# Patient Record
Sex: Female | Born: 2000 | Race: White | Hispanic: No | Marital: Single | Smoking: Current every day smoker
Health system: Southern US, Community
[De-identification: ages and names within clinical notes are randomized; demographics above are authoritative.]

## PROBLEM LIST (undated history)

## (undated) HISTORY — PX: TONSILLECTOMY: SUR1361

---
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
## 2022-05-04 ENCOUNTER — Encounter: Payer: Self-pay | Admitting: Emergency Medicine

## 2022-05-04 ENCOUNTER — Other Ambulatory Visit: Payer: Self-pay

## 2022-05-04 ENCOUNTER — Emergency Department: Payer: Self-pay

## 2022-05-04 DIAGNOSIS — M79662 Pain in left lower leg: Secondary | ICD-10-CM | POA: Insufficient documentation

## 2022-05-04 DIAGNOSIS — M25562 Pain in left knee: Secondary | ICD-10-CM | POA: Insufficient documentation

## 2022-05-04 LAB — POC URINE PREG, ED: Preg Test, Ur: NEGATIVE

## 2022-05-04 NOTE — ED Provider Triage Note (Signed)
Emergency Medicine Provider Triage Evaluation Note  GENAVIVE MELIKIAN , a 21 y.o. female  was evaluated in triage.  Pt complains of left calf pain.  Patient states that she woke up this afternoon after a nap with calf pain.  She states that that it is mostly soreness.  No reported injury.  No history of bleeding or clotting disorders..  Review of Systems  Positive: Left calf/lower extremity pain. Negative: Fever, chill, open wound  Physical Exam  BP (!) 144/97   Pulse (!) 128   Temp 99.5 F (37.5 C) (Oral)   Resp 18   SpO2 96%  Gen:   Awake, no distress   Resp:  Normal effort  MSK:   Moves extremities without difficulty.  Visualization of the left calf no visible signs of edema, erythema, open wounds, deformity.  Diffusely tender over the left calf Other:    Medical Decision Making  Medically screening exam initiated at 7:33 PM.  Appropriate orders placed.  KESIAH SPITZLEY was informed that the remainder of the evaluation will be completed by another provider, this initial triage assessment does not replace that evaluation, and the importance of remaining in the ED until their evaluation is complete.  Patient presents to the ED with left calf/lower extremity pain.  Reportedly nontraumatic in nature.  Patient was tachycardic on arrival but states that her normal heart rate is 120.  I will order ultrasound and x-ray at this time.   Darletta Moll, PA-C 05/04/22 1933

## 2022-05-04 NOTE — ED Triage Notes (Signed)
Pt presents via pov with c/o left leg pain. Pt reports pain occurred suddenly after taking nap- initially started calf and back of knee and now feels pain to entire leg. Pt reports pain is mainly when standing. Denies known injuries. Been going on since sunday

## 2022-05-05 ENCOUNTER — Emergency Department
Admission: EM | Admit: 2022-05-05 | Discharge: 2022-05-05 | Disposition: A | Discharge status: Home / Self Care | Payer: Self-pay | Attending: Emergency Medicine | Admitting: Emergency Medicine

## 2022-05-05 DIAGNOSIS — M79662 Pain in left lower leg: Secondary | ICD-10-CM

## 2022-05-05 NOTE — Discharge Instructions (Addendum)

## 2022-05-05 NOTE — ED Provider Notes (Signed)
[facility] Provider Note    Event Date/Time   First MD Initiated Contact with Patient 05/05/22 0017     (approximate)   History   Leg Pain   HPI  Jasmine Meyer is a 21 y.o. female with history of obesity but no other contributory chronic medical issues.  She presents for evaluation of acute onset pain in her left lower leg starting just above the knee and radiating down.  No known trauma.  Her mother is at the bedside and reports that she was swimming and spent a long time in the pool earlier, but the patient said that she does not remember any specific injury.  She did not hurt until she took a nap and then woke up in her leg was hurting the sharp pain.  No cramping.  No external injury and no rash.  No history of blood clots in the legs nor the lungs.  She is able to bear weight but hurts to do so.  No other symptoms.     Physical Exam   Triage Vital Signs: ED Triage Vitals  Enc Vitals Group     BP 05/04/22 1914 (!) 144/97     Pulse Rate 05/04/22 1914 (!) 128     Resp 05/04/22 1914 18     Temp 05/04/22 1914 99.5 F (37.5 C)     Temp Source 05/04/22 1914 Oral     SpO2 05/04/22 1914 96 %     Weight --      Height --      Head Circumference --      Peak Flow --      Pain Score 05/04/22 1930 0     Pain Loc --      Pain Edu? --      Excl. in [place]? --     Most recent vital signs: Vitals:   05/04/22 1914 05/05/22 0049  BP: (!) 144/97 (!) 146/88  Pulse: (!) 128 89  Resp: 18 19  Temp: 99.5 F (37.5 C)   SpO2: 96% 98%     General: Awake, no distress.  CV:  Good peripheral perfusion.  Resp:  Normal effort.  Abd:  No distention.  MSK:  No gross deformities of the left lower extremity and no significant changes when compared to right.  Soft and easily compressible compartments.  Warm extremity, normal color, no evidence of neurovascular compromise.  No evidence of cellulitis.  No palpable induration in the popliteal fossa nor elsewhere along  venous distribution.  No knee effusion no evidence of bursitis.  Some pain/tenderness with passive flexion/extension of the knee, but the pain seems to be coming from the muscle of her calf.   ED Results / Procedures / Treatments   Labs (all labs ordered are listed, but only abnormal results are displayed) Labs Reviewed  POC URINE PREG, ED       RADIOLOGY I personally viewed and interpreted the patient's left tibia/fibula x-rays.  I see no evidence of fracture nor dislocation.  I also personally viewed and interpreted the patient's venous ultrasound.  I could see no evidence of infection such as cobblestoning or abscess.  The radiologist did not see any sign of an acute abnormality including DVT.    PROCEDURES:  Critical Care performed: No  Procedures   MEDICATIONS ORDERED IN ED: Medications - No data to display   IMPRESSION / MDM / [place] / ED COURSE  I reviewed the triage vital signs and the  nursing notes.                              Differential diagnosis includes, but is not limited to, DVT, fracture/dislocation, muscle cramp, musculoskeletal strain, compartment syndrome.  Patient's presentation is most consistent with acute complicated illness / injury requiring diagnostic workup.  Vital signs are stable and within normal limits.  She was initially tachycardic but that resolved by the time I saw her.  I saw her about 5 hours after her initial triage and she still has some pain but has not progressed or worsened.  As document above, her x-rays and ultrasound are normal with no evidence of acute abnormality.  Symptoms are most consistent with musculoskeletal strain after the diagnostic work-up was reassuring and her physical exam is essentially normal.  Mother and patient are comfortable with the plan for discharge and outpatient follow-up.  I gave my usual and customary return precautions.         FINAL CLINICAL IMPRESSION(S) / ED DIAGNOSES    Final diagnoses:  Pain in left lower leg     Rx / DC Orders   ED Discharge Orders     None        Note:  This document was prepared using Dragon voice recognition software and may include unintentional dictation errors.   Hinda Kehr, MD 05/05/22 [phone]

## 2022-09-23 ENCOUNTER — Other Ambulatory Visit: Payer: Self-pay

## 2022-09-23 ENCOUNTER — Emergency Department
Admission: EM | Admit: 2022-09-23 | Discharge: 2022-09-23 | Disposition: A | Discharge status: Home / Self Care | Payer: Commercial Managed Care - HMO | Attending: Emergency Medicine | Admitting: Emergency Medicine

## 2022-09-23 DIAGNOSIS — G43809 Other migraine, not intractable, without status migrainosus: Secondary | ICD-10-CM | POA: Insufficient documentation

## 2022-09-23 DIAGNOSIS — R519 Headache, unspecified: Secondary | ICD-10-CM | POA: Diagnosis present

## 2022-09-23 MED ORDER — ACETAMINOPHEN 325 MG PO TABS
650.0000 mg | ORAL_TABLET | Freq: Once | ORAL | Status: AC
  Administered 2022-09-23: 650 mg via ORAL
  Filled 2022-09-23: qty 2

## 2022-09-23 MED ORDER — SODIUM CHLORIDE 0.9 % IV SOLN: Freq: Once | INTRAVENOUS | Status: AC

## 2022-09-23 MED ORDER — DIPHENHYDRAMINE HCL 50 MG/ML IJ SOLN
25.0000 mg | Freq: Once | INTRAMUSCULAR | Status: AC
  Administered 2022-09-23: 25 mg via INTRAVENOUS
  Filled 2022-09-23: qty 1

## 2022-09-23 MED ORDER — KETOROLAC TROMETHAMINE 30 MG/ML IJ SOLN
30.0000 mg | Freq: Once | INTRAMUSCULAR | Status: AC
  Administered 2022-09-23: 30 mg via INTRAVENOUS
  Filled 2022-09-23: qty 1

## 2022-09-23 MED ORDER — METOCLOPRAMIDE HCL 5 MG/ML IJ SOLN
20.0000 mg | Freq: Once | INTRAVENOUS | Status: AC
  Administered 2022-09-23: 20 mg via INTRAVENOUS
  Filled 2022-09-23: qty 4

## 2022-09-23 NOTE — ED Notes (Signed)
Patient discharged to home per MD order. Patient in stable condition, and deemed medically cleared by ED provider for discharge. Discharge instructions reviewed with patient/family using "Teach Back"; verbalized understanding of medication education and administration, and information about follow-up care. Denies further concerns. ° °

## 2022-09-23 NOTE — ED Provider Notes (Signed)
[facility] Provider Note    Event Date/Time   First MD Initiated Contact with Patient 09/23/22 1314     (approximate)   History   Migraine and Blurred Vision   HPI  Jasmine Meyer is a 21 y.o. female with a history of migraines presents with complaints of a migraine headache.  Patient reports she has blurred vision which she has had before.  She states that her headache started yesterday and has continued through the night which has had before but is less common for her.  Complains of light sensitivity.  No neurodeficits     Physical Exam   Triage Vital Signs: ED Triage Vitals  Enc Vitals Group     BP 09/23/22 1221 (!) 121/90     Pulse Rate 09/23/22 1221 94     Resp 09/23/22 1221 16     Temp 09/23/22 1221 98.9 F (37.2 C)     Temp Source 09/23/22 1221 Oral     SpO2 09/23/22 1221 97 %     Weight 09/23/22 1223 113.4 kg (250 lb)     Height 09/23/22 1223 1.727 m (5\' 8" )     Head Circumference --      Peak Flow --      Pain Score 09/23/22 1223 8     Pain Loc --      Pain Edu? --      Excl. in [place]? --     Most recent vital signs: Vitals:   09/23/22 1221 09/23/22 1449  BP: (!) 121/90 109/84  Pulse: 94 83  Resp: 16 18  Temp: 98.9 F (37.2 C)   SpO2: 97% 98%     General: Awake, no distress.  CV:  Good peripheral perfusion.  Resp:  Normal effort.  Abd:  No distention.  Other:  Cranial nerves II through XII are normal, pupils normal   ED Results / Procedures / Treatments   Labs (all labs ordered are listed, but only abnormal results are displayed) Labs Reviewed - No data to display   EKG     RADIOLOGY     PROCEDURES:  Critical Care performed:   Procedures   MEDICATIONS ORDERED IN ED: Medications  ketorolac (TORADOL) 30 MG/ML injection 30 mg (30 mg Intravenous Given 09/23/22 1347)  0.9 %  sodium chloride infusion (0 mLs Intravenous Stopped 09/23/22 1453)  diphenhydrAMINE (BENADRYL) injection 25 mg (25 mg  Intravenous Given 09/23/22 1347)  metoCLOPramide (REGLAN) 20 mg in dextrose 5 % 50 mL IVPB (0 mg Intravenous Stopped 09/23/22 1453)  acetaminophen (TYLENOL) tablet 650 mg (650 mg Oral Given 09/23/22 1340)     IMPRESSION / MDM / ASSESSMENT AND PLAN / ED COURSE  I reviewed the triage vital signs and the nursing notes. Patient's presentation is most consistent with exacerbation of chronic illness.  Patient presents with headache consistent with migraine headache.  Symptoms are similar to prior migraine headaches.  No red flag symptoms.  Normal neuro exam  We will treat with IV Reglan, Benadryl, IV fluids, IV Toradol  Reevaluated after treatment and patient is feeling improved  She requests discharge        FINAL CLINICAL IMPRESSION(S) / ED DIAGNOSES   Final diagnoses:  Other migraine without status migrainosus, not intractable     Rx / DC Orders   ED Discharge Orders     None        Note:  This document was prepared using Dragon voice recognition software and may include unintentional  dictation errors.   Lavonia Drafts, MD 09/23/22 1524

## 2022-09-23 NOTE — ED Triage Notes (Signed)
Pt to ED with mother for migraine HA since yesterday, hx of same. Pt went to sleep around 0200 today, then woke up around 1000 with same migraine and also bilateral blurry vision.

## 2022-11-02 ENCOUNTER — Emergency Department (HOSPITAL_COMMUNITY): Payer: Commercial Managed Care - HMO

## 2022-11-02 ENCOUNTER — Emergency Department (HOSPITAL_COMMUNITY)
Admission: EM | Admit: 2022-11-02 | Discharge: 2022-11-02 | Disposition: A | Discharge status: Home / Self Care | Payer: Commercial Managed Care - HMO | Attending: Emergency Medicine | Admitting: Emergency Medicine

## 2022-11-02 ENCOUNTER — Encounter (HOSPITAL_COMMUNITY): Payer: Self-pay | Admitting: Emergency Medicine

## 2022-11-02 ENCOUNTER — Other Ambulatory Visit: Payer: Self-pay

## 2022-11-02 DIAGNOSIS — Z20822 Contact with and (suspected) exposure to covid-19: Secondary | ICD-10-CM | POA: Insufficient documentation

## 2022-11-02 DIAGNOSIS — J069 Acute upper respiratory infection, unspecified: Secondary | ICD-10-CM | POA: Insufficient documentation

## 2022-11-02 DIAGNOSIS — R509 Fever, unspecified: Secondary | ICD-10-CM | POA: Diagnosis present

## 2022-11-02 LAB — CBC
HCT: 44.8 % (ref 36.0–46.0)
Hemoglobin: 14.2 g/dL (ref 12.0–15.0)
MCH: 27.5 pg (ref 26.0–34.0)
MCHC: 31.7 g/dL (ref 30.0–36.0)
MCV: 86.7 fL (ref 80.0–100.0)
Platelets: 242 10*3/uL (ref 150–400)
RBC: 5.17 MIL/uL — ABNORMAL HIGH (ref 3.87–5.11)
RDW: 13.3 % (ref 11.5–15.5)
WBC: 6 10*3/uL (ref 4.0–10.5)
nRBC: 0 % (ref 0.0–0.2)

## 2022-11-02 LAB — URINALYSIS, ROUTINE W REFLEX MICROSCOPIC
Bacteria, UA: NONE SEEN
Bilirubin Urine: NEGATIVE
Glucose, UA: NEGATIVE mg/dL
Hgb urine dipstick: NEGATIVE
Ketones, ur: 5 mg/dL — AB
Leukocytes,Ua: NEGATIVE
Nitrite: NEGATIVE
Protein, ur: 30 mg/dL — AB
Specific Gravity, Urine: 1.029 (ref 1.005–1.030)
pH: 5 (ref 5.0–8.0)

## 2022-11-02 LAB — SARS CORONAVIRUS 2 BY RT PCR: SARS Coronavirus 2 by RT PCR: NEGATIVE

## 2022-11-02 LAB — COMPREHENSIVE METABOLIC PANEL
ALT: 14 U/L (ref 0–44)
AST: 17 U/L (ref 15–41)
Albumin: 3.5 g/dL (ref 3.5–5.0)
Alkaline Phosphatase: 46 U/L (ref 38–126)
Anion gap: 9 (ref 5–15)
BUN: 10 mg/dL (ref 6–20)
CO2: 24 mmol/L (ref 22–32)
Calcium: 8.9 mg/dL (ref 8.9–10.3)
Chloride: 107 mmol/L (ref 98–111)
Creatinine, Ser: 0.84 mg/dL (ref 0.44–1.00)
GFR, Estimated: >60 mL/min (ref >60)
Glucose, Bld: 82 mg/dL (ref 70–99)
Potassium: 3.9 mmol/L (ref 3.5–5.1)
Sodium: 140 mmol/L (ref 135–145)
Total Bilirubin: 0.3 mg/dL (ref 0.3–1.2)
Total Protein: 7.3 g/dL (ref 6.5–8.1)

## 2022-11-02 LAB — I-STAT BETA HCG BLOOD, ED (MC, WL, AP ONLY): I-stat hCG, quantitative: <5 m[IU]/mL (ref <5)

## 2022-11-02 LAB — RESP PANEL BY RT-PCR (FLU A&B, COVID) ARPGX2
Influenza A by PCR: NEGATIVE
Influenza B by PCR: NEGATIVE
SARS Coronavirus 2 by RT PCR: NEGATIVE

## 2022-11-02 LAB — LIPASE, BLOOD: Lipase: 28 U/L (ref 11–51)

## 2022-11-02 MED ORDER — PROMETHAZINE HCL 12.5 MG PO TABS: 12.5000 mg | ORAL_TABLET | Freq: Four times a day (QID) | ORAL | 0 refills | Status: AC | PRN

## 2022-11-02 MED ORDER — METOCLOPRAMIDE HCL 5 MG/ML IJ SOLN: 5.0000 mg | Freq: Once | INTRAMUSCULAR | Status: DC

## 2022-11-02 MED ORDER — LACTATED RINGERS IV BOLUS
1000.0000 mL | Freq: Once | INTRAVENOUS | Status: AC
  Administered 2022-11-02: 1000 mL via INTRAVENOUS

## 2022-11-02 MED ORDER — PROMETHAZINE HCL 25 MG PO TABS
12.5000 mg | ORAL_TABLET | Freq: Once | ORAL | Status: AC
  Administered 2022-11-02: 12.5 mg via ORAL
  Filled 2022-11-02: qty 1

## 2022-11-02 MED ORDER — ONDANSETRON 4 MG PO TBDP
4.0000 mg | ORAL_TABLET | Freq: Once | ORAL | Status: AC
  Administered 2022-11-02: 4 mg via ORAL
  Filled 2022-11-02: qty 1

## 2022-11-02 NOTE — ED Notes (Signed)
Pt given fluids to drink for PO challenge.

## 2022-11-02 NOTE — ED Provider Triage Note (Signed)
Emergency Medicine Provider Triage Evaluation Note  Jasmine Meyer , a 21 y.o. female  was evaluated in triage.  Pt complains of 1 week history of nausea, vomiting, cough, diffuse myalgias/arthralgias.  Patient notes exposure to flu, RSV and COVID multiple times over the past 1 to 2 weeks.  She is concerned about possible infection.  Denies hematemesis, chest pain, shortness of breath, fever, chills, night sweats, urinary/vaginal symptoms, change in bowel habits.  Review of Systems  Positive: See above Negative:   Physical Exam  BP (!) 121/96 (BP Location: Right Arm)   Pulse (!) 103   Temp 98.4 F (36.9 C) (Oral)   Resp 16   Ht 5\' 8"  (1.727 m)   Wt 113 kg   SpO2 100%   BMI 37.88 kg/m  Gen:   Awake, no distress   Resp:  Normal effort  MSK:   Moves extremities without difficulty  Other:  Lungs clear to auscultation bilaterally.  No obvious murmurs gallops or rubs.  Mild epigastric tenderness to palpation.  No lower extremity edema noted.  Medical Decision Making  Medically screening exam initiated at 5:11 PM.  Appropriate orders placed.  Jasmine Meyer was informed that the remainder of the evaluation will be completed by another provider, this initial triage assessment does not replace that evaluation, and the importance of remaining in the ED until their evaluation is complete.     Darl Pikes, Peter Garter 11/02/22 1715

## 2022-11-02 NOTE — Discharge Instructions (Addendum)
You were evaluated in the Emergency Department and after careful evaluation, we did not find any emergent condition requiring admission or further testing in the hospital.  Take Phenergan as needed for nausea, continue to drink plenty of fluids.  Please return to the Emergency Department if you experience any worsening of your condition.  We encourage you to follow up with a primary care provider.  Thank you for allowing [place] to be a part of your care.

## 2022-11-02 NOTE — ED Triage Notes (Signed)
Pt endorses n/v and fever for a week. States she is unable to keep anything down. Last took tylenol yesterday. Generalized body aches and sore throat.

## 2022-11-02 NOTE — ED Notes (Addendum)
Pt and mom asking about flu swab results. RN called [employer] to run the flu. They are pulling it now and will run asap. Pt updated.

## 2022-11-02 NOTE — ED Notes (Signed)
Pt called and no answer x 2

## 2022-11-02 NOTE — ED Provider Notes (Signed)
[facility] EMERGENCY DEPARTMENT Provider Note   CSN: [id number] Arrival date & time: 11/02/22  1507     History  Chief Complaint  Patient presents with   Emesis    Jasmine Meyer is a 21 y.o. female.  HPI 21 year old female with a history of migraines presents to the ER with complaints of 1 week of nausea, vomiting, cough, diffuse myalgias/arthralgias. Pt complains of 1 week history of nausea, vomiting, cough, diffuse myalgias/arthralgias.  Patient notes exposure to flu, RSV and COVID multiple times over the past 1 to 2 weeks.  She is concerned about possible infection.  Denies hematemesis, chest pain, shortness of breath, fever, chills, night sweats, urinary/vaginal symptoms, change in bowel habits.  Is currently on her menstrual cycle.    Home Medications Prior to Admission medications   Medication Sig Start Date End Date Taking? Authorizing Provider  promethazine (PHENERGAN) 12.5 MG tablet Take 1 tablet (12.5 mg total) by mouth every 6 (six) hours as needed for up to 5 days for nausea or vomiting. 11/02/22 11/07/22 Yes Mare Ferrari, PA-C  amoxicillin (AMOXIL) 875 MG tablet Take 1 tablet (875 mg total) by mouth 2 (two) times daily. 08/28/21   Triplett, Tammy, PA-C  fexofenadine-pseudoephedrine (ALLEGRA-D) 60-120 MG 12 hr tablet Take 1 tablet by mouth 2 (two) times daily. 12/14/19   Joni Reining, PA-C  naproxen (NAPROSYN) 500 MG tablet Take 1 tablet (500 mg total) by mouth 2 (two) times daily with a meal. 12/14/19   Joni Reining, PA-C      Allergies    Patient has no known allergies.    Review of Systems   Review of Systems Ten systems reviewed and are negative for acute change, except as noted in the HPI.   Physical Exam Updated Vital Signs BP 107/70   Pulse 84   Temp 98.4 F (36.9 C) (Oral)   Resp 16   Ht 5\' 8"  (1.727 m)   Wt 113 kg   SpO2 100%   BMI 37.88 kg/m  Physical Exam Vitals and nursing note reviewed.  Constitutional:       General: She is not in acute distress.    Appearance: She is well-developed.  HENT:     Head: Normocephalic and atraumatic.  Eyes:     Conjunctiva/sclera: Conjunctivae normal.  Cardiovascular:     Rate and Rhythm: Normal rate and regular rhythm.     Heart sounds: No murmur heard. Pulmonary:     Effort: Pulmonary effort is normal. No respiratory distress.     Breath sounds: Normal breath sounds.  Abdominal:     Palpations: Abdomen is soft.     Tenderness: There is no abdominal tenderness.  Musculoskeletal:        General: No swelling.     Cervical back: Neck supple.  Skin:    General: Skin is warm and dry.     Capillary Refill: Capillary refill takes less than 2 seconds.  Neurological:     Mental Status: She is alert.  Psychiatric:        Mood and Affect: Mood normal.     ED Results / Procedures / Treatments   Labs (all labs ordered are listed, but only abnormal results are displayed) Labs Reviewed  CBC - Abnormal; Notable for the following components:      Result Value   RBC 5.17 (*)    All other components within normal limits  URINALYSIS, ROUTINE W REFLEX MICROSCOPIC - Abnormal; Notable for the following  components:   APPearance HAZY (*)    Ketones, ur 5 (*)    Protein, ur 30 (*)    All other components within normal limits  SARS CORONAVIRUS 2 BY RT PCR  RESP PANEL BY RT-PCR (FLU A&B, COVID) ARPGX2  LIPASE, BLOOD  COMPREHENSIVE METABOLIC PANEL  I-STAT BETA HCG BLOOD, ED ([facility], [facility], [facility] ONLY)    EKG None  Radiology DG Chest 2 View  Result Date: 11/02/2022 CLINICAL DATA:  Cough EXAM: CHEST - 2 VIEW COMPARISON:  04/16/2018 FINDINGS: The heart size and mediastinal contours are within normal limits. Both lungs are clear. The visualized skeletal structures are unremarkable. IMPRESSION: No acute abnormality of the lungs. Electronically Signed   By: Jearld Lesch M.D.   On: 11/02/2022 17:39    Procedures Procedures    Medications Ordered in ED Medications   ondansetron (ZOFRAN-ODT) disintegrating tablet 4 mg (4 mg Oral Given 11/02/22 1551)  lactated ringers bolus 1,000 mL (1,000 mLs Intravenous New Bag/Given 11/02/22 2102)  promethazine (PHENERGAN) tablet 12.5 mg (12.5 mg Oral Given 11/02/22 2053)    ED Course/ Medical Decision Making/ A&P                           Medical Decision Making Amount and/or Complexity of Data Reviewed Labs: ordered.  Risk Prescription drug management.   21 year old female presents to the ER with URI type symptoms, nausea, vomiting.  Vitals reassuring.  Physical exam unremarkable, abdomen is soft and nontender.  Labs ordered, reviewed, CBC without leukocytosis, CMP without abnormalities, lipase normal, pregnancy negative.  COVID and flu are negative.  UA with ketonuria and proteinuria consistent with dehydration.  Chest x-ray ordered, reviewed, agree with radiology read, negative for pneumonia.  She was given Phenergan and Zofran for nausea, LR bolus, fluid challenge with improvement.  Reports adverse reaction to Reglan.  Will send home with Phenergan.  Urged to continue gentle fluid resuscitation.  We discussed return precautions.  She voiced her standing and is agreeable.  Stable for discharge.  Final Clinical Impression(s) / ED Diagnoses Final diagnoses:  Viral URI    Rx / DC Orders ED Discharge Orders          Ordered    promethazine (PHENERGAN) 12.5 MG tablet  Every 6 hours PRN        11/02/22 2211              Mare Ferrari, PA-C 11/02/22 2212    Sloan Leiter, DO 11/03/22 2348

## 2022-11-19 IMAGING — DX DG TIBIA/FIBULA 2V*L*
4 series · 4 of 4 positions shown · non-contrast
Comparison: 08/20/2019

CLINICAL DATA: Left calf pain for 1 day

EXAM:
LEFT TIBIA AND FIBULA - 2 VIEW

[tibia ap (1 of 2)]
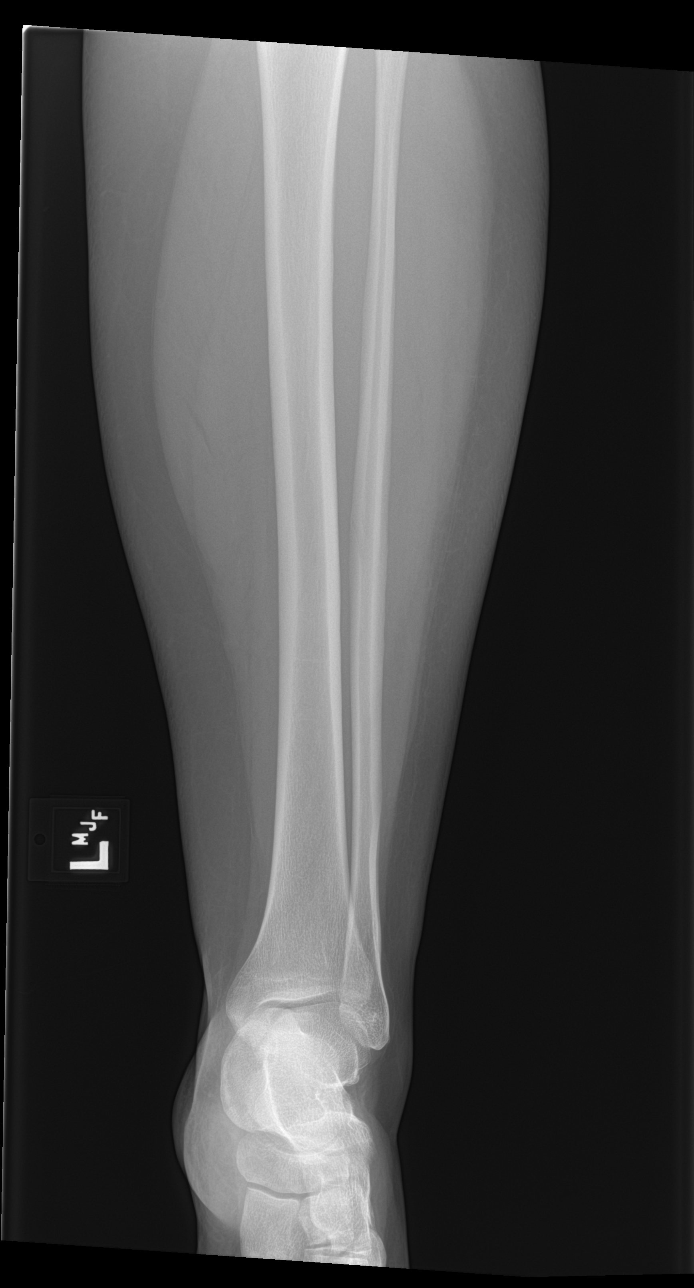

[tibia ap (2 of 2)]
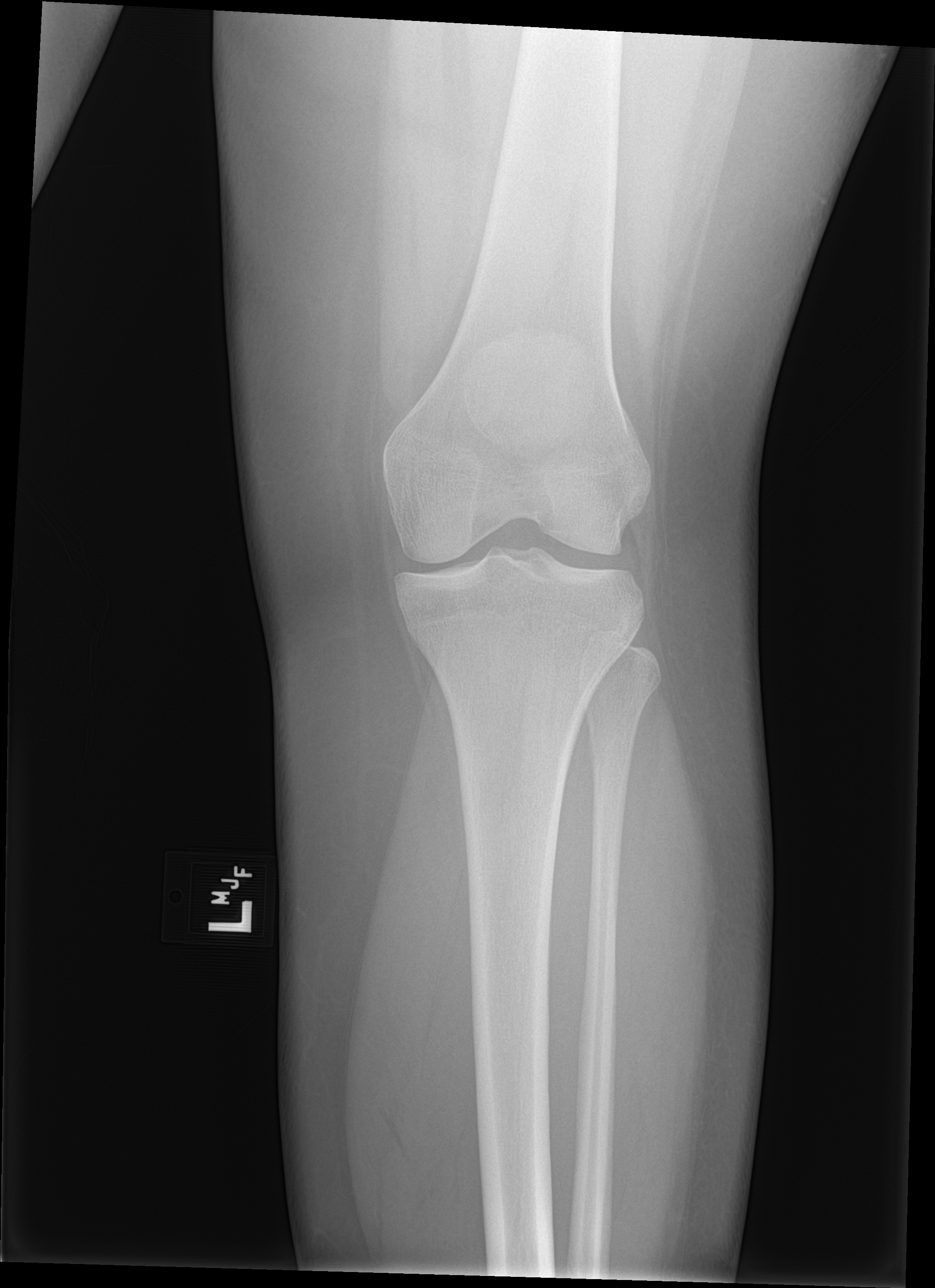

[tibia lat (1 of 2)]
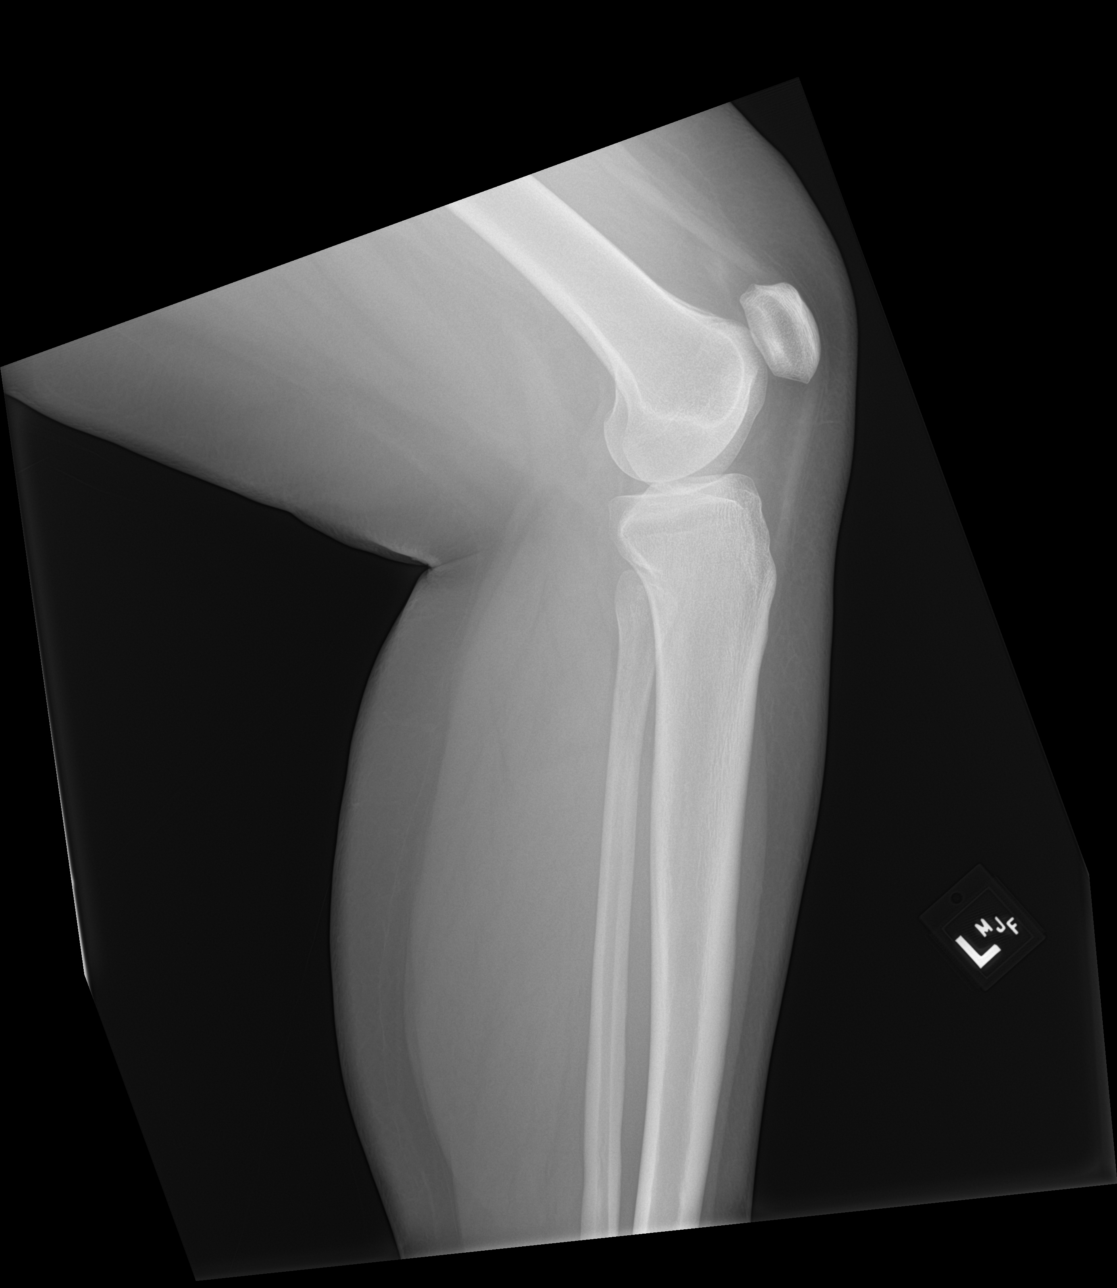

[tibia lat (2 of 2)]
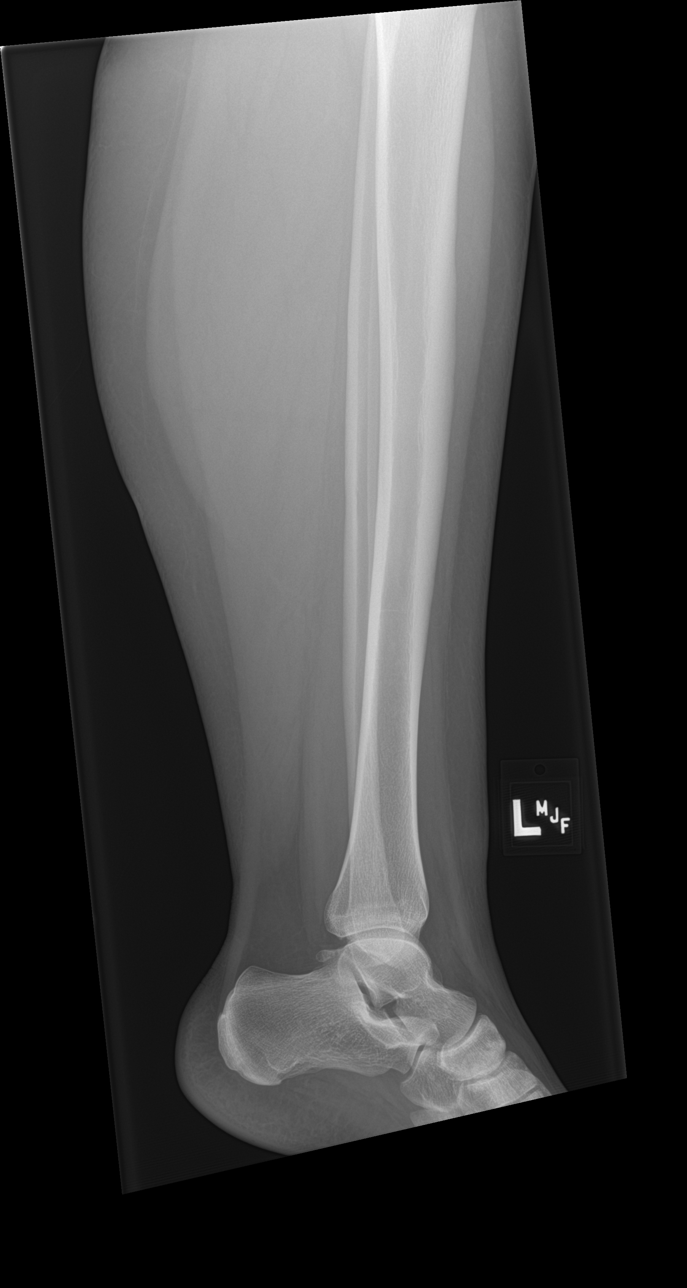

[4 of 4 positions shown; findings below may reference images not displayed]

FINDINGS: Frontal and lateral views of the left tibia and fibula are obtained.
No acute fracture, subluxation, or dislocation. Joint spaces are
well preserved. Soft tissues are unremarkable.
IMPRESSION: 1. Unremarkable left tibia and fibula.

## 2023-03-05 ENCOUNTER — Emergency Department
Admission: EM | Admit: 2023-03-05 | Discharge: 2023-03-05 | Disposition: A | Discharge status: Home / Self Care | Payer: Commercial Managed Care - HMO | Attending: Emergency Medicine | Admitting: Emergency Medicine

## 2023-03-05 ENCOUNTER — Encounter: Payer: Self-pay | Admitting: Emergency Medicine

## 2023-03-05 ENCOUNTER — Emergency Department: Payer: Commercial Managed Care - HMO

## 2023-03-05 DIAGNOSIS — R000 Tachycardia, unspecified: Secondary | ICD-10-CM | POA: Insufficient documentation

## 2023-03-05 DIAGNOSIS — R1013 Epigastric pain: Secondary | ICD-10-CM | POA: Insufficient documentation

## 2023-03-05 DIAGNOSIS — R0789 Other chest pain: Secondary | ICD-10-CM | POA: Diagnosis not present

## 2023-03-05 DIAGNOSIS — R079 Chest pain, unspecified: Secondary | ICD-10-CM

## 2023-03-05 DIAGNOSIS — R109 Unspecified abdominal pain: Secondary | ICD-10-CM

## 2023-03-05 DIAGNOSIS — R1011 Right upper quadrant pain: Secondary | ICD-10-CM | POA: Diagnosis present

## 2023-03-05 LAB — HEPATIC FUNCTION PANEL
ALT: 31 U/L (ref 0–44)
AST: 25 U/L (ref 15–41)
Albumin: 3.8 g/dL (ref 3.5–5.0)
Alkaline Phosphatase: 47 U/L (ref 38–126)
Bilirubin, Direct: <0.1 mg/dL (ref 0.0–0.2)
Total Bilirubin: 0.5 mg/dL (ref 0.3–1.2)
Total Protein: 7.7 g/dL (ref 6.5–8.1)

## 2023-03-05 LAB — BASIC METABOLIC PANEL
Anion gap: 12 (ref 5–15)
BUN: 9 mg/dL (ref 6–20)
CO2: 21 mmol/L — ABNORMAL LOW (ref 22–32)
Calcium: 8.9 mg/dL (ref 8.9–10.3)
Chloride: 105 mmol/L (ref 98–111)
Creatinine, Ser: 0.65 mg/dL (ref 0.44–1.00)
GFR, Estimated: >60 mL/min (ref >60)
Glucose, Bld: 83 mg/dL (ref 70–99)
Potassium: 3.2 mmol/L — ABNORMAL LOW (ref 3.5–5.1)
Sodium: 138 mmol/L (ref 135–145)

## 2023-03-05 LAB — CBC
HCT: 42.1 % (ref 36.0–46.0)
Hemoglobin: 13.8 g/dL (ref 12.0–15.0)
MCH: 27.8 pg (ref 26.0–34.0)
MCHC: 32.8 g/dL (ref 30.0–36.0)
MCV: 84.9 fL (ref 80.0–100.0)
Platelets: 319 10*3/uL (ref 150–400)
RBC: 4.96 MIL/uL (ref 3.87–5.11)
RDW: 12.9 % (ref 11.5–15.5)
WBC: 11.1 10*3/uL — ABNORMAL HIGH (ref 4.0–10.5)
nRBC: 0 % (ref 0.0–0.2)

## 2023-03-05 LAB — TROPONIN I (HIGH SENSITIVITY): Troponin I (High Sensitivity): <2 ng/L (ref <18)

## 2023-03-05 LAB — LIPASE, BLOOD: Lipase: 30 U/L (ref 11–51)

## 2023-03-05 LAB — POC URINE PREG, ED: Preg Test, Ur: NEGATIVE

## 2023-03-05 MED ORDER — DICYCLOMINE HCL 10 MG PO CAPS: 10.0000 mg | ORAL_CAPSULE | Freq: Three times a day (TID) | ORAL | 0 refills | Status: AC | PRN

## 2023-03-05 NOTE — ED Provider Notes (Signed)
[facility] Provider Note    Event Date/Time   First MD Initiated Contact with Patient 03/05/23 2211     (approximate)  History   Chief Complaint: Chest Pain  HPI  Jasmine Meyer is a 22 y.o. female with no significant past medical history who presents to the emergency department for right upper quadrant abdominal pain and pain into her chest.  According to the patient over the past 3 to 4 days she has been experiencing pain in the right upper quadrant with occasional radiation into the chest.  Patient states it seems to be worse after she eats food.  States a strong family history of gallbladder disease including her sister and mother but she has never had gallbladder issues personally.  Patient denies any fever.  Physical Exam   Triage Vital Signs: ED Triage Vitals  Enc Vitals Group     BP 03/05/23 2034 (!) 147/92     Pulse Rate 03/05/23 2034 (!) 115     Resp 03/05/23 2034 20     Temp 03/05/23 2034 98.6 F (37 C)     Temp Source 03/05/23 2034 Oral     SpO2 03/05/23 2034 98 %     Weight 03/05/23 2034 230 lb (104.3 kg)     Height 03/05/23 2034 5\' 7"  (1.702 m)     Head Circumference --      Peak Flow --      Pain Score 03/05/23 2041 10     Pain Loc --      Pain Edu? --      Excl. in [place]? --     Most recent vital signs: Vitals:   03/05/23 2034  BP: (!) 147/92  Pulse: (!) 115  Resp: 20  Temp: 98.6 F (37 C)  SpO2: 98%    General: Awake, no distress.  CV:  Good peripheral perfusion.  Regular rate and rhythm  Resp:  Normal effort.  Equal breath sounds bilaterally.  Chest wall is nontender. Abd:  No distention.  Soft, mild right upper quadrant epigastric tenderness.  No rebound or guarding.  ED Results / Procedures / Treatments   EKG  EKG viewed and interpreted by myself shows sinus tachycardia 102 bpm with a narrow QRS, normal axis, normal intervals, no concerning ST changes.  RADIOLOGY  I have reviewed and interpreted the chest  x-ray images.  No consolidation seen on my evaluation. Radiology is read the chest x-ray as negative. Ultrasound is negative for any gallbladder disease.  MEDICATIONS ORDERED IN ED: Medications - No data to display   IMPRESSION / MDM / [place] / ED COURSE  I reviewed the triage vital signs and the nursing notes.  Patient's presentation is most consistent with acute presentation with potential threat to life or bodily function.  Patient presents emergency department for right upper quadrant abdominal pain over the past 3 to 4 days with some radiation into the chest.  Overall the patient appears well, no significant discomfort on exam although states mild tenderness to palpation in the upper abdomen.  No fever.  Patient's workup shows a clear chest x-ray, reassuring EKG besides mild tachycardia.  Patient's CBC is normal, chemistry reassuring and troponin reassuringly negative.  Lipase is normal as well.  Given the patient's right upper quadrant discomfort I have added on hepatic function panel as well as a right upper quadrant ultrasound to further evaluate.  Patient agreeable to plan of care.  Patient's workup is reassuring.  Ultrasound is  negative for gallbladder disease.  Patient's hepatic function panel is negative/normal.  Lipase is normal.  Given the patient's complaint of pain discussed possibility of intestinal type pain we will place the patient on Bentyl.  Discussed return precautions as well as PCP follow-up.  Patient agreeable to plan of care.  FINAL CLINICAL IMPRESSION(S) / ED DIAGNOSES   Right upper quadrant abdominal pain   Note:  This document was prepared using Dragon voice recognition software and may include unintentional dictation errors.   Harvest Dark, MD 03/05/23 2311

## 2023-03-05 NOTE — ED Triage Notes (Signed)
Pt presents ambulatory to triage via POV with complaints of epigastric/midsternal CP for the last 2-3 days. Hx of gallbladder "attacks" notes it feels similar to that. Rates the pain 10/10 with mild nausea. A&Ox4 at this time. Denies SOB.

## 2023-04-06 ENCOUNTER — Emergency Department
Admission: EM | Admit: 2023-04-06 | Discharge: 2023-04-06 | Disposition: A | Discharge status: Home / Self Care | Payer: Commercial Managed Care - HMO | Attending: Emergency Medicine | Admitting: Emergency Medicine

## 2023-04-06 ENCOUNTER — Other Ambulatory Visit: Payer: Self-pay

## 2023-04-06 DIAGNOSIS — T23251A Burn of second degree of right palm, initial encounter: Secondary | ICD-10-CM | POA: Insufficient documentation

## 2023-04-06 DIAGNOSIS — X102XXA Contact with fats and cooking oils, initial encounter: Secondary | ICD-10-CM | POA: Diagnosis not present

## 2023-04-06 DIAGNOSIS — Z23 Encounter for immunization: Secondary | ICD-10-CM | POA: Diagnosis not present

## 2023-04-06 DIAGNOSIS — Y990 Civilian activity done for income or pay: Secondary | ICD-10-CM | POA: Diagnosis not present

## 2023-04-06 DIAGNOSIS — T23051A Burn of unspecified degree of right palm, initial encounter: Secondary | ICD-10-CM | POA: Diagnosis present

## 2023-04-06 DIAGNOSIS — T300 Burn of unspecified body region, unspecified degree: Secondary | ICD-10-CM

## 2023-04-06 MED ORDER — TETANUS-DIPHTH-ACELL PERTUSSIS 5-2.5-18.5 LF-MCG/0.5 IM SUSY
0.5000 mL | PREFILLED_SYRINGE | Freq: Once | INTRAMUSCULAR | Status: AC
  Administered 2023-04-06: 0.5 mL via INTRAMUSCULAR
  Filled 2023-04-06: qty 0.5

## 2023-04-06 MED ORDER — ACETAMINOPHEN 325 MG PO TABS
650.0000 mg | ORAL_TABLET | Freq: Once | ORAL | Status: AC
  Administered 2023-04-06: 650 mg via ORAL
  Filled 2023-04-06: qty 2

## 2023-04-06 NOTE — Discharge Instructions (Signed)
Please apply bacitracin to your hand as we discussed.  Your tetanus was updated.  You may take Tylenol/ibuprofen per package instructions to help with your symptoms.  Please follow-up with your outpatient provider.  Please return for any new, worsening, or change in symptoms or other concerns.  It was a pleasure caring for you today.  Please go to the following website to schedule new (and existing) patient appointments:   [email or web address]   The following is a list of primary care offices in the area who are accepting new patients at this time.  Please reach out to one of them directly and let them know you would like to schedule an appointment to follow up on an Emergency Department visit, and/or to establish a new primary care provider (PCP).  There are likely other primary care clinics in the are who are accepting new patients, but this is an excellent place to start:  [facility] Lead physician: Dr Shirlee Latch [address] [place], [place] [address] [phone]  [facility] Lead Physician: Dr Alba Cory [address], [place], [place] [address] [phone]  [facility]  Lead Physician: Dr Olevia Perches [address] [place], [place] [address] [phone]  [facility] Lead Physician: Dr Sofie Hartigan [address] [place], [place] [address] [phone]  [facility] Primary Care & Sports Medicine at [facility] Lead Physician: Dr Bari Edward [address] [place], [place], [place] [address] [phone]

## 2023-04-06 NOTE — ED Triage Notes (Signed)
Pt comes with c/o right hand burn while at work. Pt states this is not workers comp. Pt has redness noted to palm.

## 2023-04-06 NOTE — ED Provider Notes (Signed)
[facility] Provider Note    Event Date/Time   First MD Initiated Contact with Patient 04/06/23 1440     (approximate)   History   Hand Burn   HPI  Jasmine Meyer is a 22 y.o. female who presents today for evaluation of burn to her right hand.  Patient reports that this happened from [place] at work.  He reports that the oil was on for for only a couple seconds and she washed it off immediately.  She reports that she has pain to her palms.  She reports that she is able to move her fingers normally.  She is unsure of her last tetanus shot.  She has not taken anything for pain.  There are no problems to display for this patient.         Physical Exam   Triage Vital Signs: ED Triage Vitals  Enc Vitals Group     BP 04/06/23 1355 (!) 147/98     Pulse Rate 04/06/23 1355 88     Resp 04/06/23 1355 16     Temp 04/06/23 1355 99.2 F (37.3 C)     Temp Source 04/06/23 1355 Oral     SpO2 04/06/23 1355 100 %     Weight 04/06/23 1356 230 lb (104.3 kg)     Height 04/06/23 1356  (1.702 m)     Head Circumference --      Peak Flow --      Pain Score 04/06/23 1359 7     Pain Loc --      Pain Edu? --      Excl. in [place]? --     Most recent vital signs: Vitals:   04/06/23 1355  BP: (!) 147/98  Pulse: 88  Resp: 16  Temp: 99.2 F (37.3 C)  SpO2: 100%    Physical Exam Vitals and nursing note reviewed.  Constitutional:      General: Awake and alert. No acute distress.    Appearance: Normal appearance. The patient is normal weight.  HENT:     Head: Normocephalic and atraumatic.     Mouth: Mucous membranes are moist.  Eyes:     General: PERRL. Normal EOMs        Right eye: No discharge.        Left eye: No discharge.     Conjunctiva/sclera: Conjunctivae normal.  Cardiovascular:     Rate and Rhythm: Normal rate and regular rhythm.     Pulses: Normal pulses.  Pulmonary:     Effort: Pulmonary effort is normal. No respiratory distress.      Breath sounds: Normal breath sounds.  Abdominal:     Abdomen is soft. There is no abdominal tenderness. No rebound or guarding. No distention. Musculoskeletal:        General: No swelling. Normal range of motion.     Cervical back: Normal range of motion and neck supple.  Skin:    General: Skin is warm and dry.     Capillary Refill: Capillary refill takes less than 2 seconds.     Findings: Right hand with erythema to the palm including the ulnar aspect of the palm and the thenar eminence.  It is tender to palpation.  There is blanching.  Very small blisters noted to the area measuring 1 x 1 cm to the lateral aspect of the hand below the pinky.  She has full normal range of motion of all of her fingers, normal range of motion at  the wrist.  Mild skin thickening noted to the ulnar aspect of the palm and a area measuring 1 x 1 cm.  Compartments are soft and compressible throughout.  Intrinsic muscle function intact. Neurological:     Mental Status: The patient is awake and alert.      ED Results / Procedures / Treatments   Labs (all labs ordered are listed, but only abnormal results are displayed) Labs Reviewed - No data to display   EKG     RADIOLOGY     PROCEDURES:  Critical Care performed:   Procedures   MEDICATIONS ORDERED IN ED: Medications  Tdap (BOOSTRIX) injection 0.5 mL (0.5 mLs Intramuscular Given 04/06/23 1514)  acetaminophen (TYLENOL) tablet 650 mg (650 mg Oral Given 04/06/23 1514)     IMPRESSION / MDM / ASSESSMENT AND PLAN / ED COURSE  I reviewed the triage vital signs and the nursing notes.   Differential diagnosis includes, but is not limited to, burn, first-degree burn, second-degree burn.  Patient is awake and alert, hemodynamically stable and neurovascularly intact.  There is no blistering.  She has pain and blanching of the skin.  There is normal capillary refill.  Symptoms most consistent with superficial burn for the majority of her hand, with a  possible 1 x 1 cm area to the ulnar aspect of the hand that may be consistent with superficial partial burn.  Area was cleaned.  Her tetanus was updated.  We discussed the importance of managing with bacitracin.  Discussed expected timeline for improvement.  We discussed return precautions and importance of close outpatient follow-up.  Patient presents and agrees with plan.  She was discharged with her family member in stable condition.   Patient's presentation is most consistent with acute illness / injury with system symptoms.    FINAL CLINICAL IMPRESSION(S) / ED DIAGNOSES   Final diagnoses:  Burn     Rx / DC Orders   ED Discharge Orders     None        Note:  This document was prepared using Dragon voice recognition software and may include unintentional dictation errors.   Keturah Shavers 04/06/23 1653    Concha Se, MD 04/07/23 [phone]

## 2024-12-23 ENCOUNTER — Other Ambulatory Visit: Payer: Self-pay

## 2024-12-23 ENCOUNTER — Emergency Department
Admission: EM | Admit: 2024-12-23 | Discharge: 2024-12-23 | Disposition: A | Discharge status: Home / Self Care | Payer: Self-pay

## 2024-12-23 ENCOUNTER — Emergency Department: Payer: Self-pay

## 2024-12-23 DIAGNOSIS — M25532 Pain in left wrist: Secondary | ICD-10-CM | POA: Insufficient documentation

## 2024-12-23 NOTE — ED Notes (Signed)
 Pt provided discharge instructions and prescription information. Pt was given the opportunity to ask questions and questions were answered.

## 2024-12-23 NOTE — ED Triage Notes (Signed)
 Pt to ED for L wrist injury and pain from mechanical fall 1 week ago. Pain to radial and ulnar sides and with thumb movement. Small amount of swelling noted.

## 2024-12-23 NOTE — Discharge Instructions (Addendum)
 Please wear the thumb spica splints for extra support, and follow-up with our hand surgeon.  Please return for any new, worsening, or changing symptoms or other concerns.  Please rest, ice, elevate your hand in the meantime.  It was a pleasure caring for you today.

## 2024-12-23 NOTE — ED Provider Notes (Signed)
 "  [facility] Provider Note    Event Date/Time   First MD Initiated Contact with Patient 12/23/24 1240     (approximate)   History   Wrist Injury   HPI  Jasmine Meyer is a 24 y.o. female who presents today for evaluation of left wrist pain.  Patient reports that she had a mechanical trip and fall 1 week ago and landed on her left outstretched hand.  She reports that she has had pain to both sides of her wrist and also with movement of her thumb.  There are no active problems to display for this patient.         Physical Exam   Triage Vital Signs: ED Triage Vitals  Encounter Vitals Group     BP 12/23/24 1227 119/86     Girls Systolic BP Percentile --      Girls Diastolic BP Percentile --      Boys Systolic BP Percentile --      Boys Diastolic BP Percentile --      Pulse Rate 12/23/24 1227 89     Resp 12/23/24 1227 16     Temp 12/23/24 1227 98.7 F (37.1 C)     Temp Source 12/23/24 1227 Oral     SpO2 12/23/24 1227 99 %     Weight 12/23/24 1223 230 lb (104.3 kg)     Height 12/23/24 1223 5' 5 (1.651 m)     Head Circumference --      Peak Flow --      Pain Score 12/23/24 1222 8     Pain Loc --      Pain Education --      Exclude from Growth Chart --     Most recent vital signs: Vitals:   12/23/24 1227  BP: 119/86  Pulse: 89  Resp: 16  Temp: 98.7 F (37.1 C)  SpO2: 99%    Physical Exam Vitals and nursing note reviewed.  Constitutional:      General: Awake and alert. No acute distress.    Appearance: Normal appearance. The patient is normal weight.  HENT:     Head: Normocephalic and atraumatic.     Mouth: Mucous membranes are moist.  Eyes:     General: PERRL. Normal EOMs        Right eye: No discharge.        Left eye: No discharge.     Conjunctiva/sclera: Conjunctivae normal.  Cardiovascular:     Rate and Rhythm: Normal rate and regular rhythm.     Pulses: Normal pulses.  Pulmonary:     Effort: Pulmonary effort is  normal.     Breath sounds: Normal breath sounds.  Abdominal:     Abdomen is soft. There is no abdominal tenderness. No rebound or guarding. No distention. Musculoskeletal:        General: No swelling. Normal range of motion.     Cervical back: Normal range of motion and neck supple.  Left wrist: No obvious deformities, swelling, ecchymosis, or skin changes noted.  Tenderness to the snuffbox.  Able to give thumbs up, cross fingers, make okay sign and hold closed against resistance.  Normal radial pulse.  No tenderness to ulnar aspect of wrist.  No tenderness to hand, forearm, elbow, humerus, or shoulder. Skin:    General: Skin is warm and dry.     Capillary Refill: Capillary refill takes less than 2 seconds.     Findings: No rash.  Neurological:  Mental Status: The patient is awake and alert.      ED Results / Procedures / Treatments   Labs (all labs ordered are listed, but only abnormal results are displayed) Labs Reviewed - No data to display   EKG     RADIOLOGY I independently reviewed and interpreted imaging and agree with radiologists findings.     PROCEDURES:  Critical Care performed:   Procedures   MEDICATIONS ORDERED IN ED: Medications - No data to display   IMPRESSION / MDM / ASSESSMENT AND PLAN / ED COURSE  I reviewed the triage vital signs and the nursing notes.   Differential diagnosis includes, but is not limited to, scaphoid fracture, wrist fracture, wrist sprain.    Patient is awake and alert, hemodynamically stable and afebrile.  She is neurovascularly intact.  Normal intrinsic muscle function of her hand, normal and full range of motion of her wrist.  There are no deformities.  There are no open wounds.  X-ray is negative for any acute bony injuries.  She does have tenderness over her snuffbox, will put her in a thumb spica splint for possible scaphoid related injury.  She was instructed to follow-up with hand surgery and the appropriate  follow-up information was provided.  We discussed rest, ice, elevation in the meantime.  We also discussed return precautions.  Patient understands and agrees with plan.  Discharged in stable condition.   Patient's presentation is most consistent with acute complicated illness / injury requiring diagnostic workup.    FINAL CLINICAL IMPRESSION(S) / ED DIAGNOSES   Final diagnoses:  Left wrist pain     Rx / DC Orders   ED Discharge Orders     None        Note:  This document was prepared using Dragon voice recognition software and may include unintentional dictation errors.   Mariame Rybolt E, PA-C 12/23/24 1516    Mulvihill, Caitlin M, MD 12/23/24 1924  "
